# Patient Record
Sex: Female | Born: 1955 | Race: Black or African American | Hispanic: No | Marital: Married | Smoking: Never smoker
Health system: Southern US, Community
[De-identification: ages and names within clinical notes are randomized; demographics above are authoritative.]

## PROBLEM LIST (undated history)

## (undated) HISTORY — PX: APPENDECTOMY: SHX54

## (undated) HISTORY — PX: HIP SURGERY: SHX245

---

## 2003-12-31 ENCOUNTER — Other Ambulatory Visit: Payer: Self-pay

## 2007-10-16 ENCOUNTER — Emergency Department: Payer: Self-pay | Admitting: Emergency Medicine

## 2009-04-09 ENCOUNTER — Emergency Department: Payer: Self-pay | Admitting: Emergency Medicine

## 2014-10-30 ENCOUNTER — Ambulatory Visit: Payer: Self-pay | Admitting: Family Medicine

## 2014-11-06 ENCOUNTER — Ambulatory Visit: Payer: Self-pay | Admitting: Family Medicine

## 2014-11-25 ENCOUNTER — Ambulatory Visit: Payer: Self-pay | Admitting: Physician Assistant

## 2015-04-22 IMAGING — CR DG WRIST COMPLETE 3+V*R*
4 series · 4 of 4 positions shown · non-contrast
Comparison: None.

CLINICAL DATA: Twisted right wrist 1 week ago with pain

EXAM:
RIGHT WRIST - COMPLETE 3+ VIEW

[wrist pa]
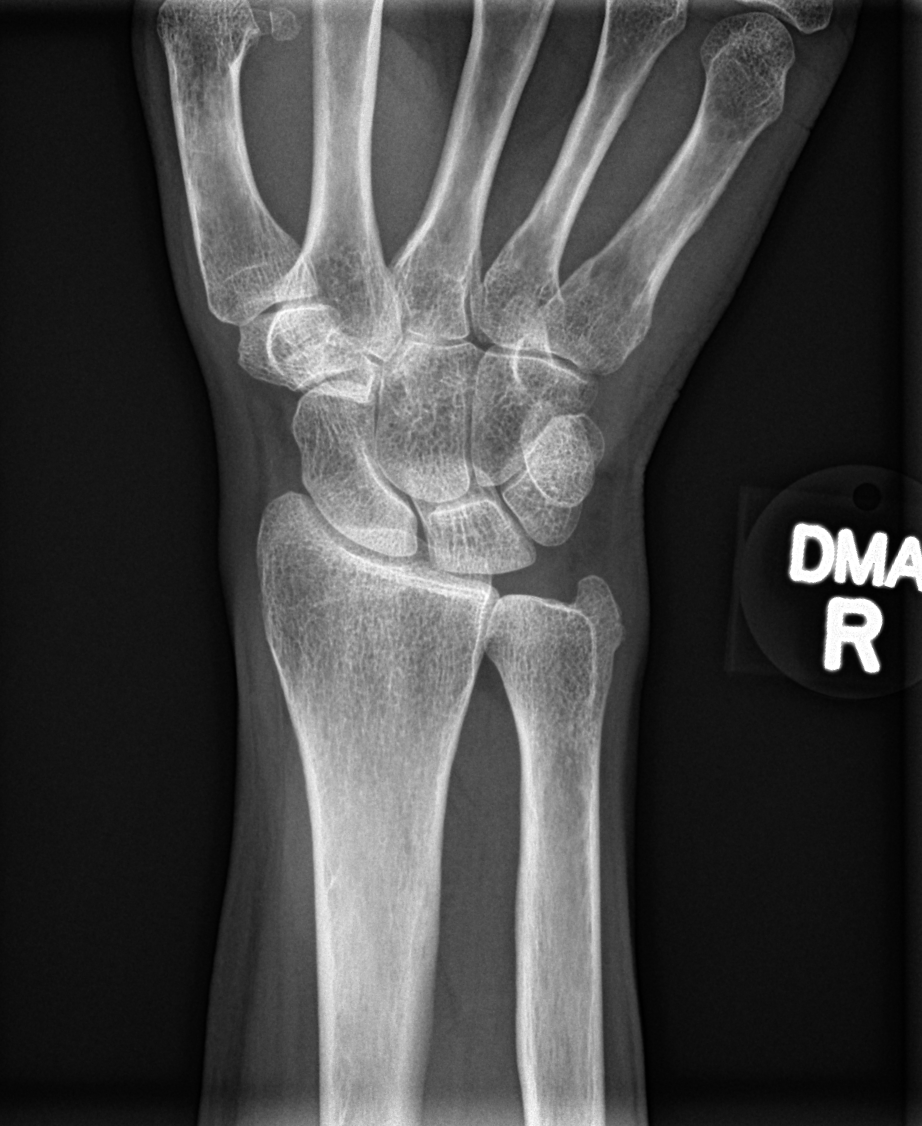

[wrist obl]
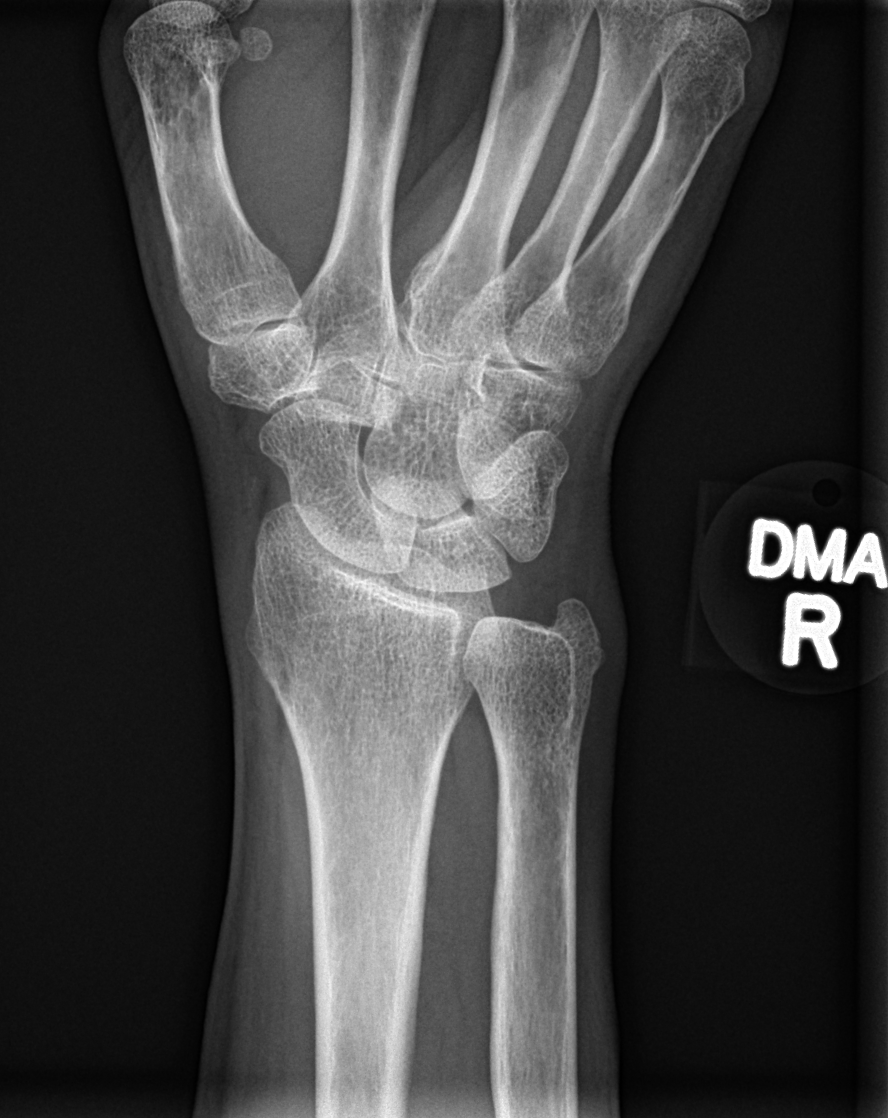

[wrist lat]
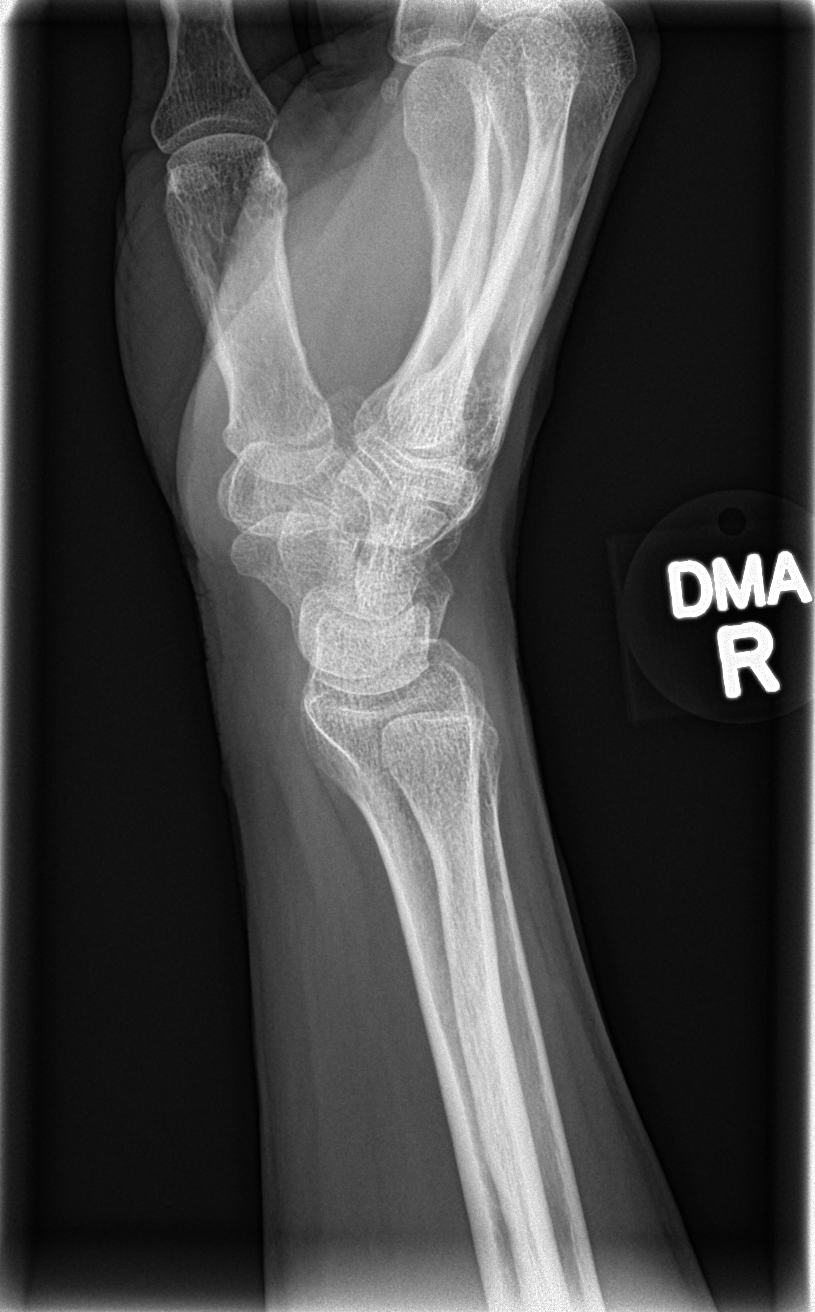

[wrist navicular]
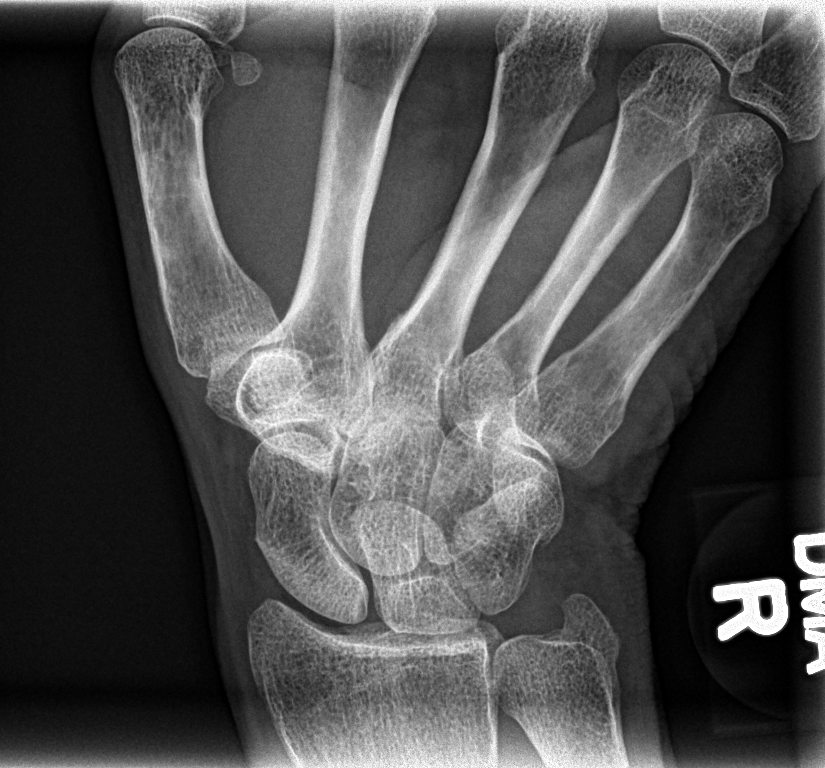

[4 of 4 positions shown; findings below may reference images not displayed]

FINDINGS: The radiocarpal joint space with is within normal limits for age.
The ulnar styloid is intact. The carpal bones are in normal position
with normal intercarpal joint spaces. Alignment is normal.
IMPRESSION: Negative.

## 2019-05-02 ENCOUNTER — Encounter: Payer: Self-pay | Admitting: Emergency Medicine

## 2019-05-02 ENCOUNTER — Other Ambulatory Visit: Payer: Self-pay

## 2019-05-02 ENCOUNTER — Ambulatory Visit
Admission: EM | Admit: 2019-05-02 | Discharge: 2019-05-02 | Disposition: A | Discharge status: Home / Self Care | Payer: Worker's Compensation

## 2019-05-02 DIAGNOSIS — S46912A Strain of unspecified muscle, fascia and tendon at shoulder and upper arm level, left arm, initial encounter: Secondary | ICD-10-CM | POA: Diagnosis not present

## 2019-05-02 DIAGNOSIS — M79602 Pain in left arm: Secondary | ICD-10-CM | POA: Diagnosis not present

## 2019-05-02 DIAGNOSIS — Z042 Encounter for examination and observation following work accident: Secondary | ICD-10-CM | POA: Diagnosis not present

## 2019-05-02 MED ORDER — IBUPROFEN 800 MG PO TABS: 800.0000 mg | ORAL_TABLET | Freq: Three times a day (TID) | ORAL | 0 refills | Status: AC

## 2019-05-02 MED ORDER — METHOCARBAMOL 500 MG PO TABS: 500.0000 mg | ORAL_TABLET | Freq: Two times a day (BID) | ORAL | 0 refills | Status: AC

## 2019-05-02 NOTE — ED Triage Notes (Signed)
Patient stated that she turning a knob on the machine today at work and pulled her left arm. Patient c/o upper left arm pain.

## 2019-05-02 NOTE — Discharge Instructions (Signed)
It was very nice seeing you today in clinic. Thank you for entrusting me with your care.   As discussed, it seems as if you have a muscle strain with some spasm. Please utilize the medications that we discussed. Your prescriptions have been called in to your pharmacy. Apply heat TID for at least 10-15 minutes at a time.  Make arrangements to follow up with occupation medicine provider on Monday to discuss return to work.If your symptoms/condition worsens, please seek follow up care either here or in the ER. Please remember, our [place] providers are "right here with you" when you need [place].   Again, it was my pleasure to take care of you today. Thank you for choosing our clinic. I hope that you start to feel better quickly.   Honor Loh, MSN, APRN, FNP-C, CEN Advanced Practice Provider [facility]

## 2019-05-02 NOTE — ED Notes (Signed)
Urine Drug Screen collected per protocol and placed in the [facility] lab for pick up.

## 2019-05-02 NOTE — ED Provider Notes (Signed)
[address], [address] [place], [place] [address] [phone]   Name: Amber Flowers DOB: February 16, 1956 MRN: [id number] CSN: [id number] PCP: System, Pcp Not In  Arrival date and time:  05/02/19 1102  Chief Complaint:  Arm Pain (left) and Worker's Com Injury  NOTE: Prior to seeing the patient today, I have reviewed the triage nursing documentation and vital signs. Clinical staff has updated patient's PMH/PSHx, current medication list, and drug allergies/intolerances to ensure comprehensive history available to assist in medical decision making.   History:   HPI: Amber Flowers is a 63 y.o. female who presents today with complaints of pain in her LEFT upper extremity. Patient reports that she was performing her normal job duties today at [employer] when the injury occurred. She advises that she was attempting to "turn a knob" on her machine, which caused her to experience acute onset pain in her LEFT shoulder shooting down into her elbow. Patient states, "they were there working on the machine earlier. I talked to the man and he told me that he didn't change the clutch". Patient describes the pain as being an intense ache that runs through her upper arm and terminating at the level of the elbow. AROM reduced secondary to the pain; unable to abduct her arm due to the pain. She states, "I can't lift it. It feels so heavy".  (+) PMS noted distal to the site of reported pain. Extremity warm and dry. Capillary refill WNL in the hand. Patient denies distal paraesthesias. There are no other injuries or complaints today in clinic. Patient notes that injury occurred just prior to arrival, thus she has not attempted any interventions to help improve her pain.   History reviewed. No pertinent past medical history.  Past Surgical History:  Procedure Laterality Date  . APPENDECTOMY    . HIP SURGERY Left     Family History  Problem Relation Age of Onset  . Hypertension  Mother   . Diabetes Mother     Social History   Socioeconomic History  . Marital status: Married    Spouse name: Not on file  . Number of children: Not on file  . Years of education: Not on file  . Highest education level: Not on file  Occupational History  . Not on file  Social Needs  . Financial resource strain: Not on file  . Food insecurity    Worry: Not on file    Inability: Not on file  . Transportation needs    Medical: Not on file    Non-medical: Not on file  Tobacco Use  . Smoking status: Never Smoker  . Smokeless tobacco: Never Used  Substance and Sexual Activity  . Alcohol use: Never    Frequency: Never  . Drug use: Never  . Sexual activity: Not on file  Lifestyle  . Physical activity    Days per week: Not on file    Minutes per session: Not on file  . Stress: Not on file  Relationships  . Social Musicianconnections    Talks on phone: Not on file    Gets together: Not on file    Attends religious service: Not on file    Active member of club or organization: Not on file    Attends meetings of clubs or organizations: Not on file    Relationship status: Not on file  . Intimate partner violence    Fear of current or ex partner: Not on file    Emotionally abused: Not  on file    Physically abused: Not on file    Forced sexual activity: Not on file  Other Topics Concern  . Not on file  Social History Narrative  . Not on file    There are no active problems to display for this patient.   Home Medications:    No outpatient medications have been marked as taking for the 05/02/19 encounter [facility](Hospital Encounter).    Allergies:   Oxycodone, Azithromycin, Levofloxacin, and Codeine  Review of Systems (ROS): Review of Systems  Constitutional: Negative for chills and fever.  Respiratory: Negative for cough and shortness of breath.   Cardiovascular: Negative for chest pain and palpitations.  Musculoskeletal: Negative for back pain and neck pain.       Acute pain  in LEFT shoulder that extends down the back of the arm into elbow following work related injury  Neurological: Positive for weakness (LUE 2/2 acute injury). Negative for numbness.     Physical Exam:  Triage Vital Signs ED Triage Vitals [05/02/19 1116]  Enc Vitals Group     BP      Pulse      Resp      Temp      Temp src      SpO2      Weight 130 lb (59 kg)     Height 5\' 4"  (1.626 m)     Head Circumference      Peak Flow      Pain Score 10     Pain Loc      Pain Edu?      Excl. in GC?     Physical Exam  Constitutional: She is oriented to person, place, and time and well-developed, well-nourished, and in no distress.  HENT:  Head: Normocephalic and atraumatic.  Mouth/Throat: Oropharynx is clear and moist and mucous membranes are normal.  Eyes: Pupils are equal, round, and reactive to light. EOM are normal.  Neck: Normal range of motion. Neck supple. No tracheal deviation present.  Cardiovascular: Normal rate, regular rhythm, normal heart sounds and intact distal pulses. Exam reveals no gallop and no friction rub.  No murmur heard. Pulmonary/Chest: Effort normal and breath sounds normal. No respiratory distress. She has no wheezes. She has no rales.  Musculoskeletal:     Left shoulder: She exhibits decreased range of motion, tenderness, pain, spasm and decreased strength. She exhibits no bony tenderness, no swelling, no deformity, no laceration and normal pulse.     Left elbow: She exhibits normal range of motion, no swelling, no effusion and no deformity. No tenderness found.     Left upper arm: She exhibits tenderness (along triceps area ). She exhibits no bony tenderness, no swelling, no edema and no deformity.     Comments: Muscle strength in LUE decreased 2/2 acute pain. Ability to actively abduct extremity reduced; patient unable to abduct against opposition.   Neurological: She is alert and oriented to person, place, and time. She has normal sensation and intact cranial  nerves. No sensory deficit.  Reflex Scores:      Tricep reflexes are 4+ on the right side and 4+ on the left side.      Bicep reflexes are 4+ on the right side and 4+ on the left side.      Brachioradialis reflexes are 4+ on the right side and 4+ on the left side. Skin: Skin is warm and dry. No rash noted. No erythema.  Psychiatric: Mood, affect and judgment normal.  Nursing  note and vitals reviewed.    Urgent Care Treatments / Results:   LABS: PLEASE NOTE: all labs that were ordered this encounter are listed, however only abnormal results are displayed. Labs Reviewed - No data to display  EKG: -None  RADIOLOGY: No results found.  PROCEDURES: Procedures  MEDICATIONS RECEIVED THIS VISIT: Medications - No data to display  PERTINENT CLINICAL COURSE NOTES/UPDATES:   Initial Impression / Assessment and Plan / Urgent Care Course:    Amber Flowers is a 63 y.o. female [facility] with complaints of Arm Pain (left) and Worker's Com Injury  Pertinent labs & imaging results that were available during my care of the patient were personally reviewed by me and considered in my medical decision making (see lab/imaging section of note for values and interpretations).  Patient overall well appearing and in no acute distress today in clinic. Exam reveals acute pain in LEFY upper extremity following a work related injury that occurred just PTA. She exhibits reduced ROM, however there is no associated crepitus. (+) PMS with normal color, temperature, grip strength, and capillary refill; tested and compared contralaterally. Increased pain with opposed AROM. No distal paraesthesias. Based on MOI, concern for osseous injury felt to be low. Will defer imaging at this juncture. Suspect simple musculoskeletal strain with associated spasm. Will treat with anti-inflammatory (ibuprofen) medication and SMR (methocarbamol). She was educated on medications and is aware that  she cannot drive or work while on the [facility] due to potential for sedation. She was encouraged to apply heat/ice TID for at least 10-15 minutes at a time.   Current clinical condition warrants patient being out of work in order to recover from her current injury/illness. She was provided with the appropriate documentation to provide to her place of employment that will allow for her to remain out of work through 05/07/2019, at which time she will need to follow up with occupational medicine for RTW clearance. I have provided her with the name and contact information for provider Collene Gobble([place], NP) and asked her to call for an appointment to be seen.   I have reviewed the follow up and strict return precautions for any new or worsening symptoms. Patient is aware of symptoms that would be deemed urgent/emergent, and would thus require further evaluation either here or in the emergency department. At the time of discharge, she verbalized understanding and consent with the discharge plan as it was reviewed with her. All questions were fielded by provider and/or clinic staff prior to patient discharge.    Final Clinical Impressions(s) / Urgent Care Diagnoses:   Final diagnoses:  Musculoskeletal arm pain, left  Strain of left upper arm, initial encounter    New Prescriptions:   Meds ordered this encounter  Medications  . ibuprofen (ADVIL) 800 MG tablet    Sig: Take 1 tablet (800 mg total) by mouth 3 (three) times daily.    Dispense:  21 tablet    Refill:  0  . methocarbamol (ROBAXIN) 500 MG tablet    Sig: Take 1 tablet (500 mg total) by mouth 2 (two) times daily.    Dispense:  14 tablet    Refill:  0    Controlled Substance Prescriptions:  [place] Controlled Substance Registry consulted? Not Applicable  NOTE: This note was prepared using Dragon dictation software along with smaller phrase technology. Despite my best ability to proofread, there is the potential that transcriptional errors may still occur  from this process, and are completely  unintentional.     Karen Kitchens, NP 05/03/19 [phone]

## 2019-05-07 ENCOUNTER — Ambulatory Visit
Admission: RE | Admit: 2019-05-07 | Discharge: 2019-05-07 | Disposition: A | Discharge status: Home / Self Care | Payer: Self-pay | Source: Ambulatory Visit | Attending: Family Medicine | Admitting: Family Medicine

## 2019-05-07 ENCOUNTER — Ambulatory Visit
Admission: RE | Admit: 2019-05-07 | Discharge: 2019-05-07 | Disposition: A | Discharge status: Home / Self Care | Payer: Worker's Compensation | Attending: Family Medicine | Admitting: Family Medicine

## 2019-05-07 ENCOUNTER — Other Ambulatory Visit: Payer: Self-pay | Admitting: Family Medicine

## 2019-05-07 ENCOUNTER — Other Ambulatory Visit: Payer: Self-pay

## 2019-05-07 DIAGNOSIS — R52 Pain, unspecified: Secondary | ICD-10-CM

## 2019-10-28 IMAGING — CR LEFT SHOULDER - 2+ VIEW
1 series · 3 of 3 positions shown · non-contrast
Comparison: None.

CLINICAL DATA: Left shoulder pain after turning a knob on machinery
1 week ago. Initial encounter.

EXAM:
LEFT SHOULDER - 2+ VIEW

[Series 1: dg shoulder left · 0.14mm/px · 3 of 3 slices shown]
[im 1/3]
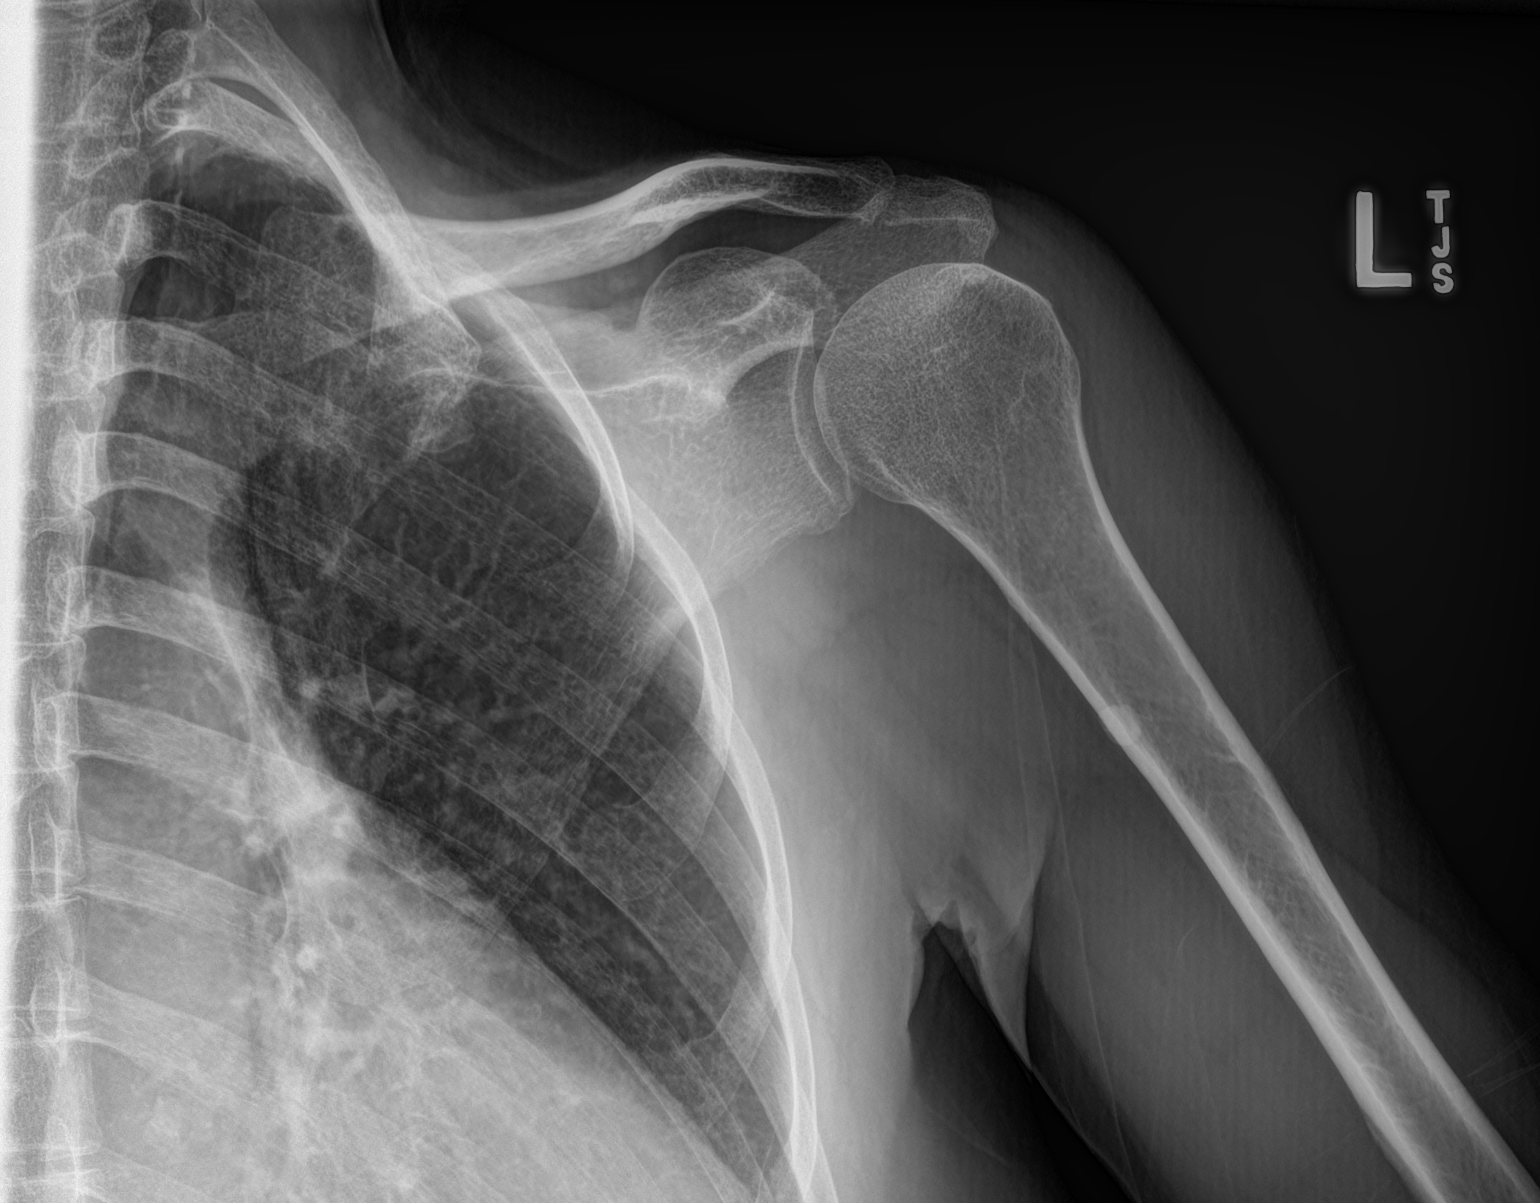
[im 2/3]
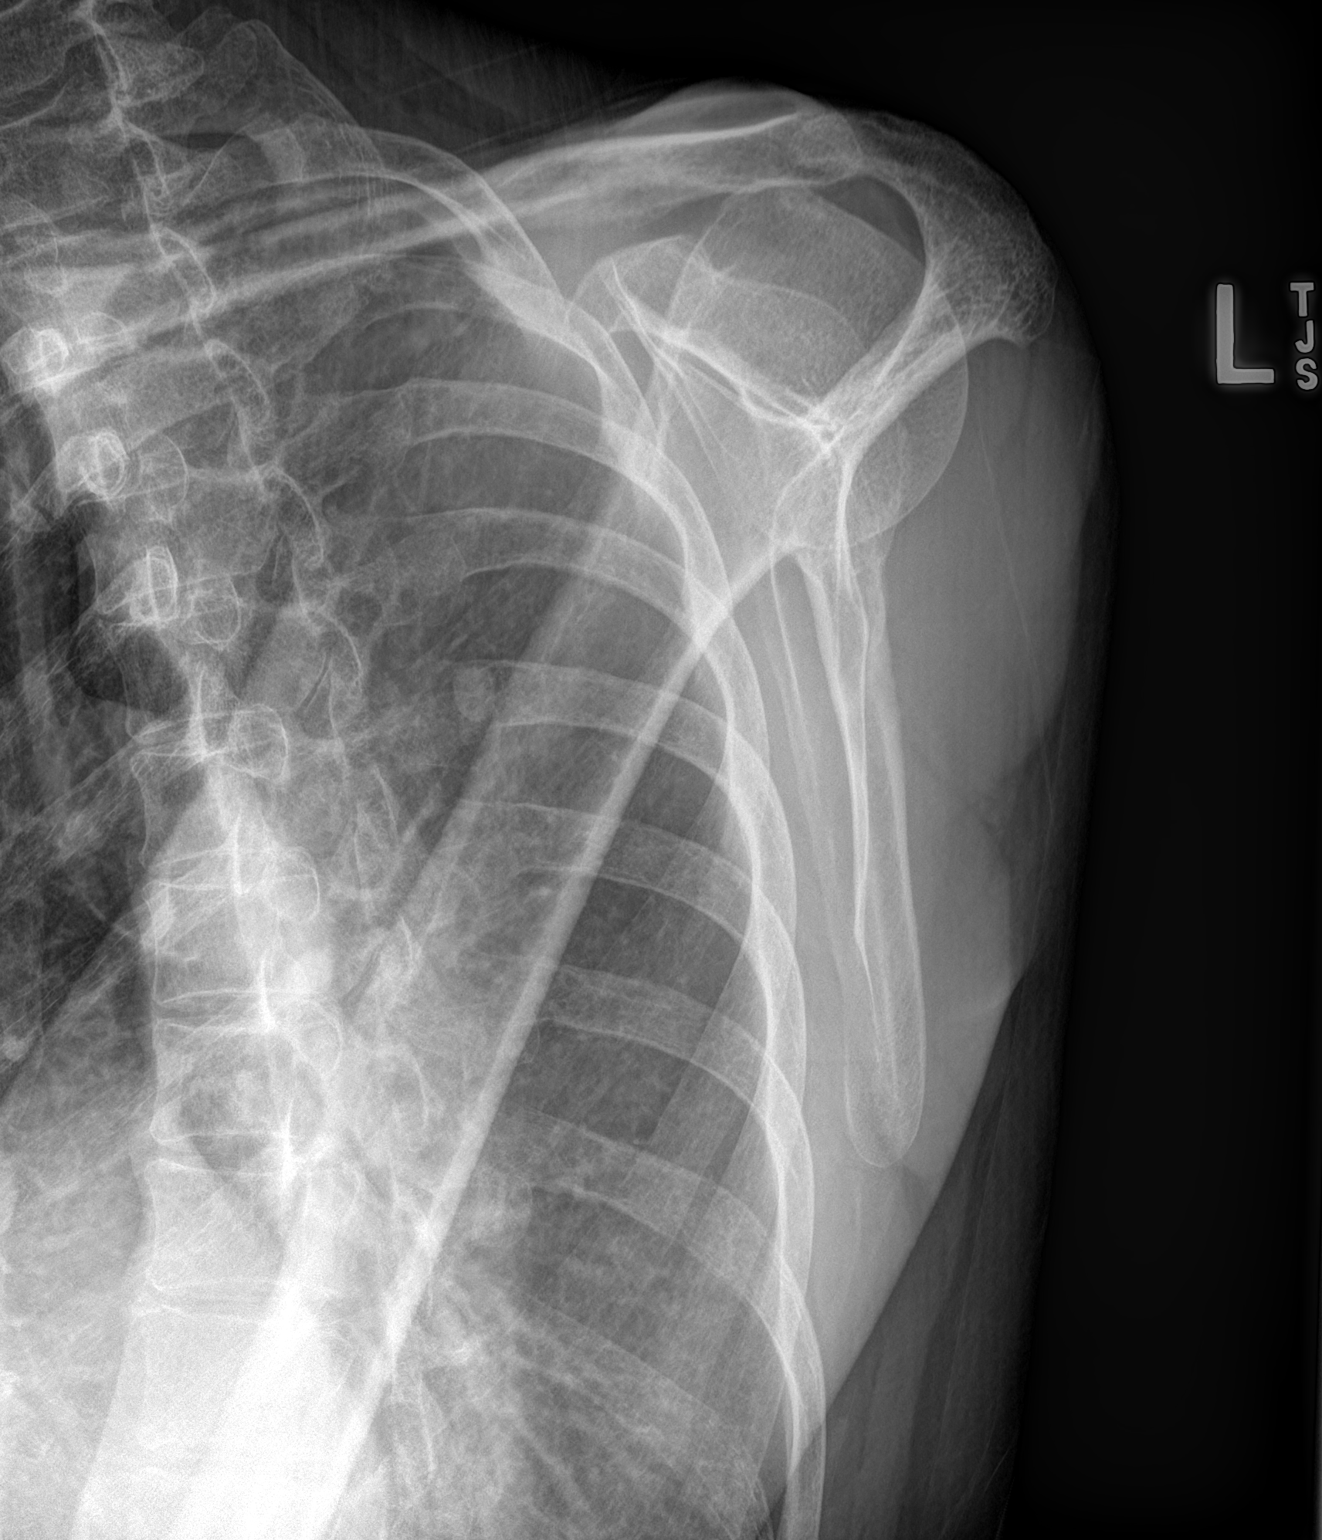
[im 3/3]
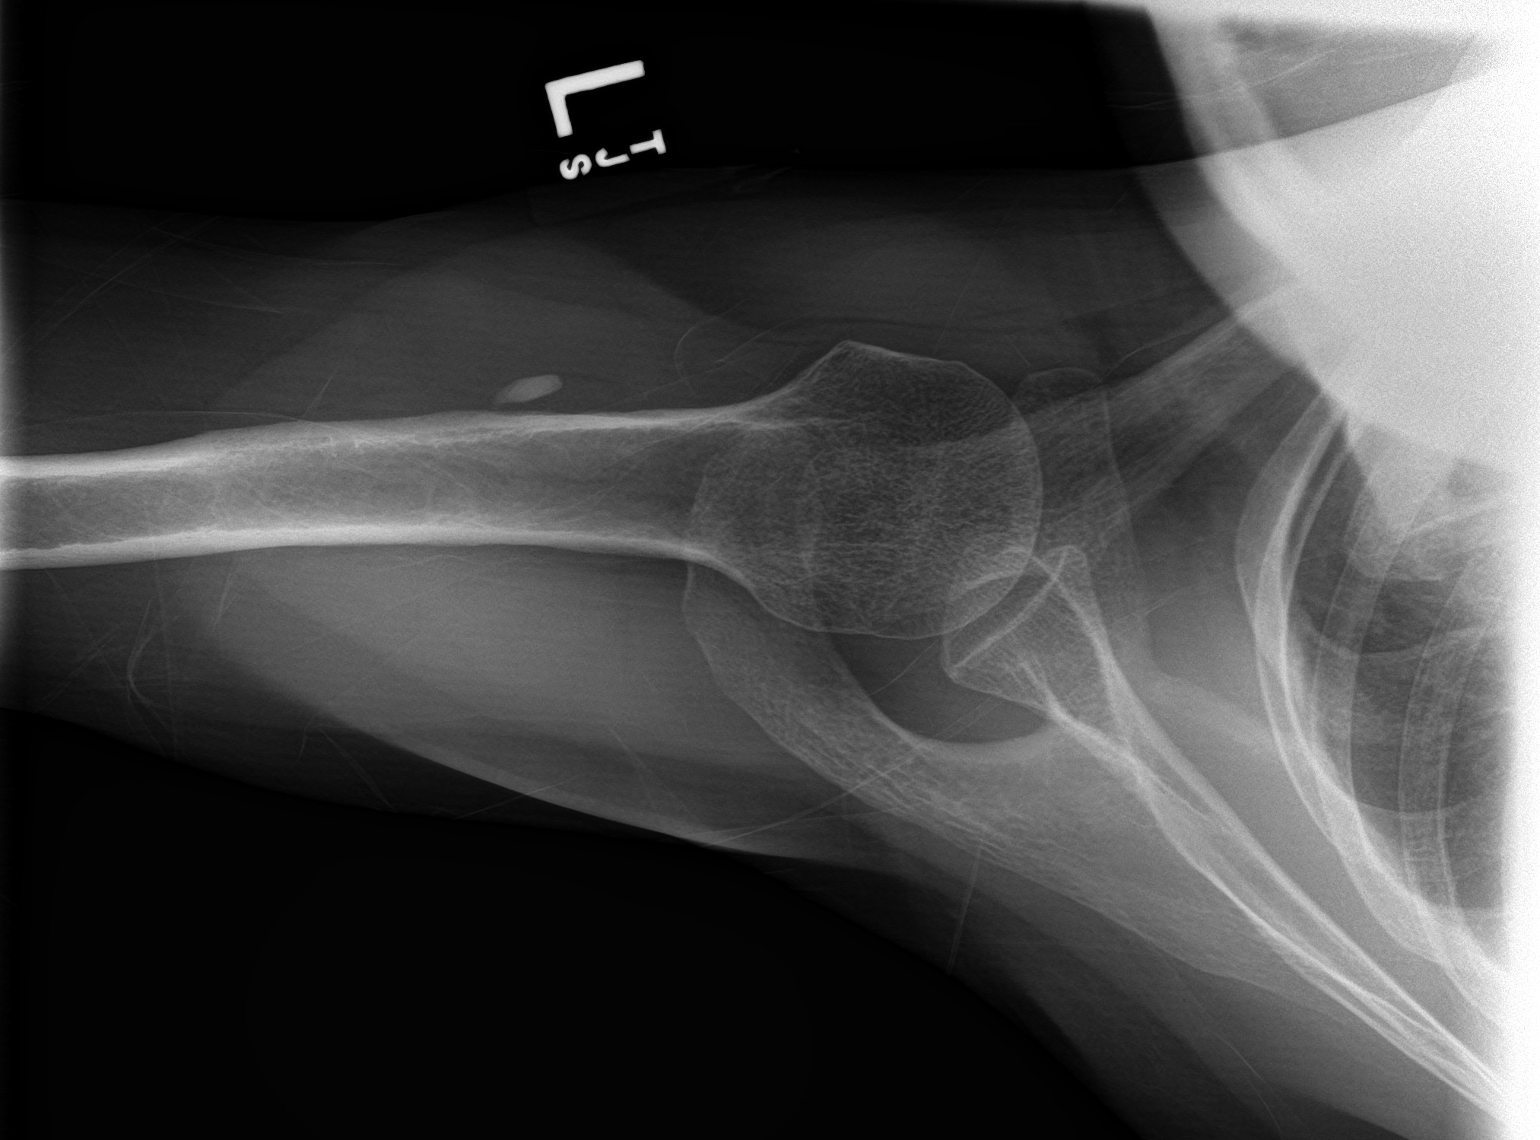

[3 of 3 positions shown; findings below may reference images not displayed]

FINDINGS: No acute bony or joint abnormality is seen. A 1 cm in length
calcification along the proximal diaphysis of the humerus is likely
a loose body in the biceps tendon sheath. No evidence of arthropathy
about the shoulder. Imaged lung parenchyma and ribs appear normal.
IMPRESSION: No acute abnormality.

Likely 1 cm loose body in the biceps tendon sheath along the
diaphysis of the humerus.
